# Patient Record
Sex: Male | Born: 1940 | Race: White | Marital: Single
Health system: Southern US, Community
[De-identification: ages and names within clinical notes are randomized; demographics above are authoritative.]

---

## 2013-11-15 ENCOUNTER — Other Ambulatory Visit: Payer: Self-pay | Admitting: Gastroenterology

## 2013-11-15 DIAGNOSIS — R109 Unspecified abdominal pain: Secondary | ICD-10-CM

## 2013-11-22 ENCOUNTER — Ambulatory Visit
Admission: RE | Admit: 2013-11-22 | Discharge: 2013-11-22 | Disposition: A | Discharge status: Home / Self Care | Payer: Medicare Other | Source: Ambulatory Visit | Attending: Gastroenterology | Admitting: Gastroenterology

## 2013-11-22 DIAGNOSIS — R109 Unspecified abdominal pain: Secondary | ICD-10-CM

## 2013-11-22 MED ORDER — IOHEXOL 300 MG/ML  SOLN
100.0000 mL | Freq: Once | INTRAMUSCULAR | Status: AC | PRN
Start: 2013-11-22 — End: 2013-11-22
  Administered 2013-11-22: 100 mL via INTRAVENOUS

## 2015-03-02 IMAGING — CT CT ABD-PELV W/ CM
2 of 5 series · 16 of 46 positions shown, 18 images · IV contrast (READICAT/WATER & [ID] OMNI 300)
Comparison: None.

CLINICAL DATA: Abdominal pain with bloating for 3 weeks. History of
scalp melanoma in 7335.

EXAM:
CT ABDOMEN AND PELVIS WITH CONTRAST
TECHNIQUE: Multidetector CT imaging of the abdomen and pelvis was performed
using the standard protocol following bolus administration of
intravenous contrast.
CONTRAST:  100mL OMNIPAQUE IOHEXOL 300 MG/ML  SOLN

[Series 2: abd/pelvis with · axial · 0.76mm/px · z∈[-438,-8]mm · 13 of 98 slices shown, 15 images]
[im 6/98  soft-tissue]
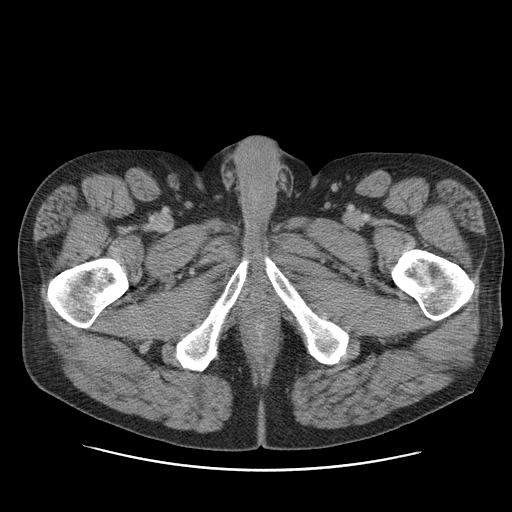
[im 6/98  bone]
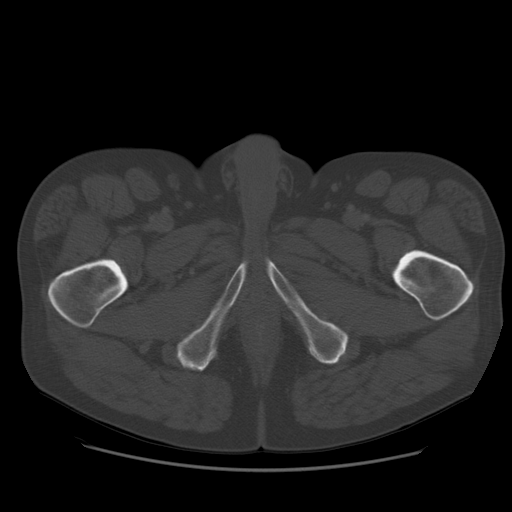
[im 16/98  soft-tissue]
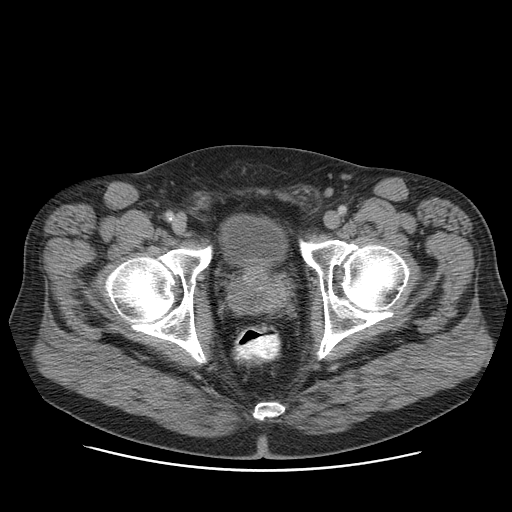
[im 21/98  soft-tissue]
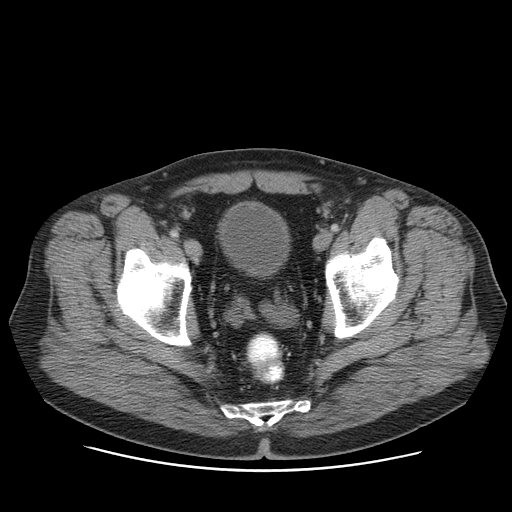
[im 26/98  soft-tissue]
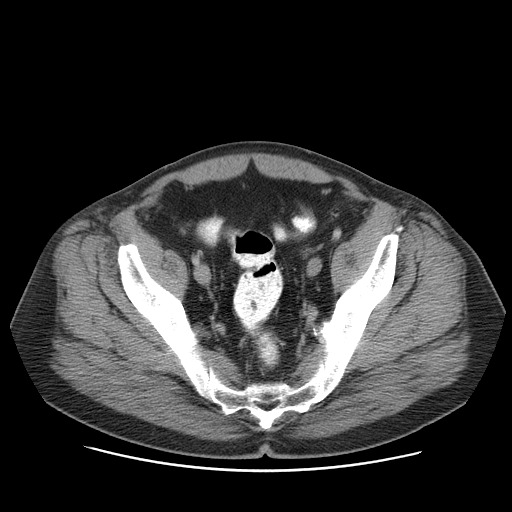
[im 36/98  soft-tissue]
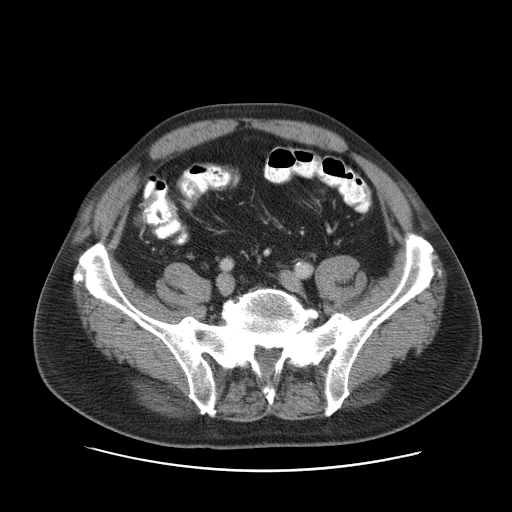
[im 41/98  soft-tissue]
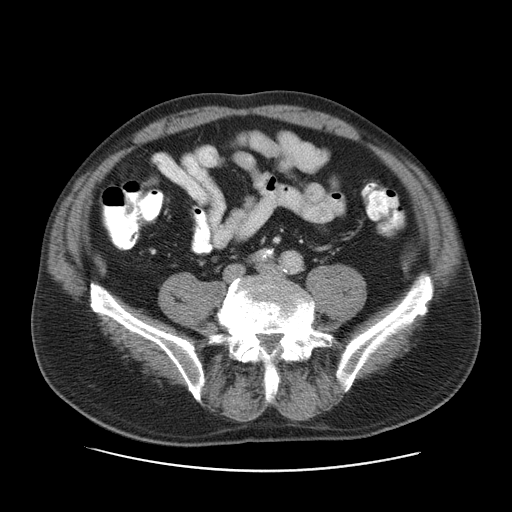
[im 52/98  soft-tissue]
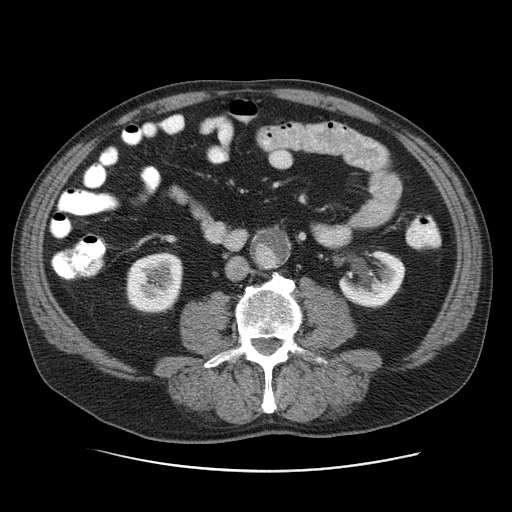
[im 57/98  soft-tissue]
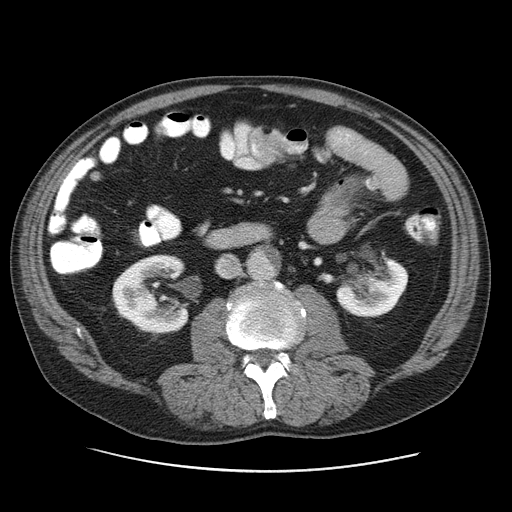
[im 62/98  soft-tissue]
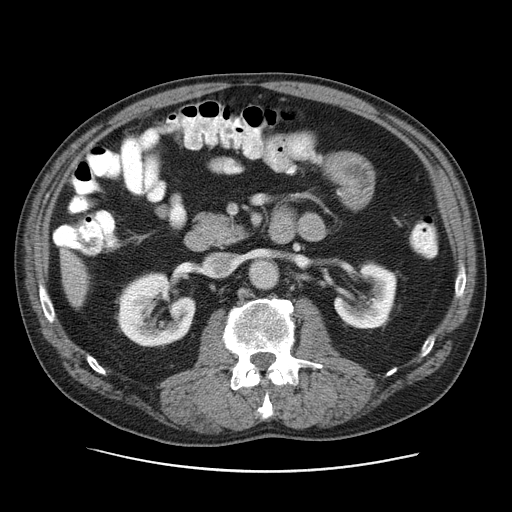
[im 62/98  bone]
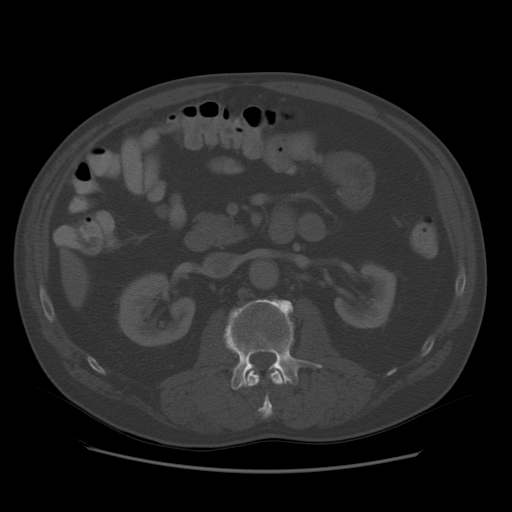
[im 72/98  soft-tissue]
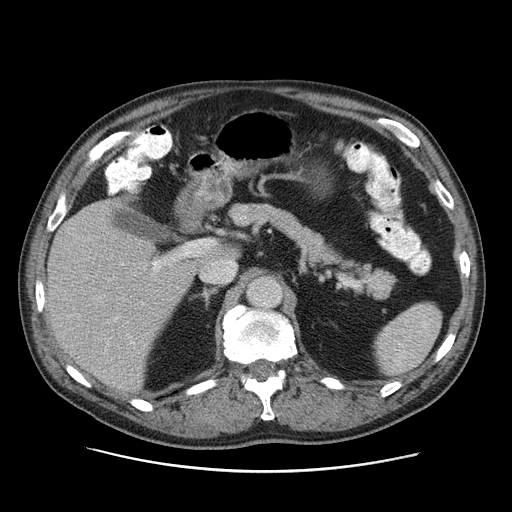
[im 77/98  soft-tissue]
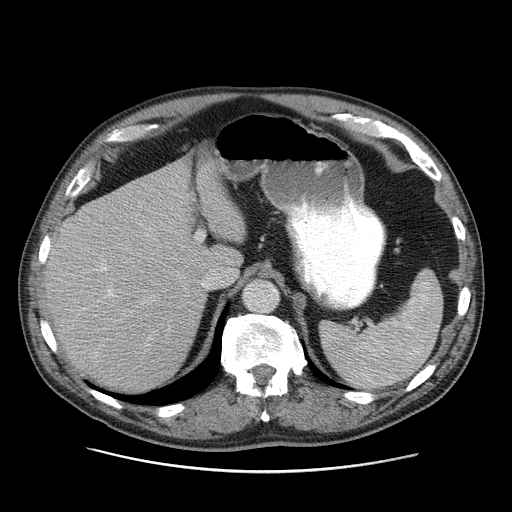
[im 82/98  soft-tissue]
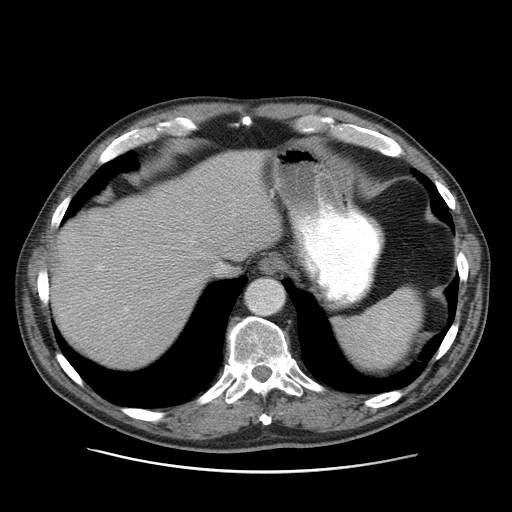
[im 92/98  soft-tissue]
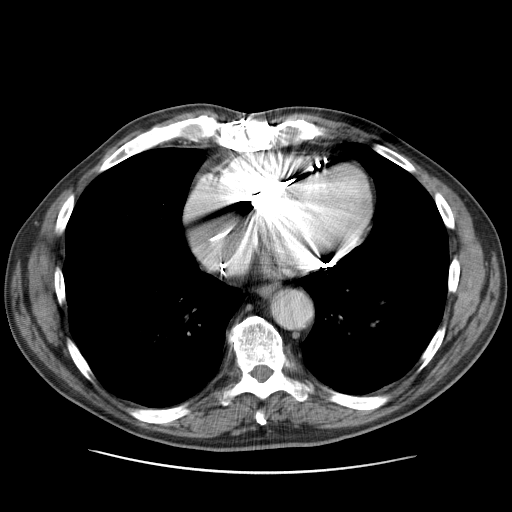

[Series 401: cor · coronal · 1.02mm/px · 3 of 144 slices shown]
[im 48/144  soft-tissue]
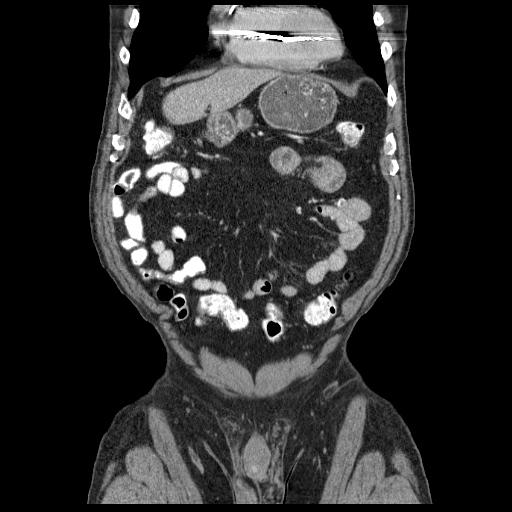
[im 64/144  soft-tissue]
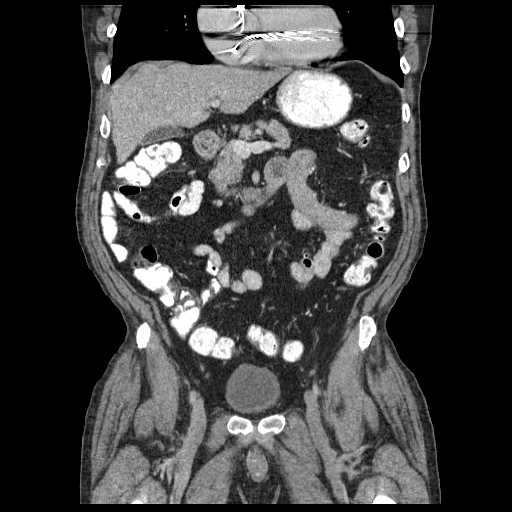
[im 80/144  soft-tissue]
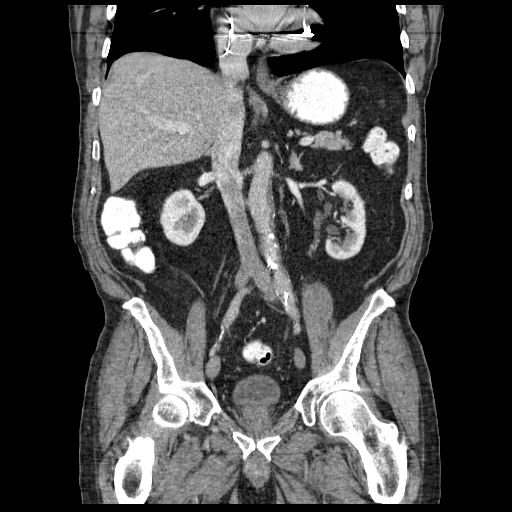

[16 of 46 positions shown; findings below may reference images not displayed]

FINDINGS: Lung bases: There is mild atelectasis or scarring in both lung
bases. Patient is status post median sternotomy. Pacemaker leads
extend into the right atrium and right ventricle. There is no
pleural or pericardial effusion.

Liver/Biliary/Pancreas: 7 mm low-density lesion in the medial
segment of the left hepatic lobe on image 20 is too small to
characterize. The liver otherwise appears unremarkable. No evidence
of gallstones, gallbladder wall thickening or biliary dilatation.
The pancreas appears normal.

Spleen/Adrenal glands: Unremarkable.

Kidneys/Ureters/Bladder: The left kidney demonstrates mild
malrotation and small parapelvic cysts. There is no evidence of
renal mass or hydronephrosis. There is no evidence of urinary tract
calculus. The bladder appears normal.

Bowel/Peritoneum: The stomach appears normal. There are multiple
small bowel diverticula. The appendix is not clearly seen. The colon
demonstrates no significant findings. No ascites or peritoneal
nodularity.

Retroperitoneum/Pelvis: There are no enlarged abdominal or pelvic
lymph nodes. There is fusiform aneurysmal dilatation of the
abdominal aorta which measures up to 3.4 cm AP on image 49. There is
moderate mural thrombus within the dilated esophageal lumen. There
is mild enlargement of the prostate gland.

Abdominal wall: No abdominal wall masses or hernias.

Musculoskeletal: No acute or significant osseus findings. There are
mild degenerative changes throughout the spine.
IMPRESSION: 1. No explanation for abdominal pain or bloating identified. There
is no evidence of bowel obstruction or ascites.
2. 3.4 cm abdominal aortic aneurysm.
3. Small liver lesion, likely incidental.
4. Small bowel diverticulosis without acute inflammation.

## 2020-12-24 ENCOUNTER — Telehealth: Payer: Self-pay | Admitting: Physical Medicine and Rehabilitation

## 2020-12-24 NOTE — Telephone Encounter (Signed)
Patient states he is waiting on Disability paperwork to be completed. Please advise status, did not see in signed folder this morning.  Thanks

## 2020-12-26 NOTE — Telephone Encounter (Signed)
Done. thanks

## 2022-11-11 DIAGNOSIS — L03031 Cellulitis of right toe: Secondary | ICD-10-CM | POA: Diagnosis not present

## 2022-11-11 DIAGNOSIS — M79674 Pain in right toe(s): Secondary | ICD-10-CM | POA: Diagnosis not present

## 2022-11-30 DIAGNOSIS — L570 Actinic keratosis: Secondary | ICD-10-CM | POA: Diagnosis not present

## 2022-11-30 DIAGNOSIS — L918 Other hypertrophic disorders of the skin: Secondary | ICD-10-CM | POA: Diagnosis not present

## 2023-04-05 DIAGNOSIS — J019 Acute sinusitis, unspecified: Secondary | ICD-10-CM | POA: Diagnosis not present

## 2023-04-05 DIAGNOSIS — R0981 Nasal congestion: Secondary | ICD-10-CM | POA: Diagnosis not present

## 2023-04-05 DIAGNOSIS — R030 Elevated blood-pressure reading, without diagnosis of hypertension: Secondary | ICD-10-CM | POA: Diagnosis not present

## 2023-04-05 DIAGNOSIS — R059 Cough, unspecified: Secondary | ICD-10-CM | POA: Diagnosis not present

## 2023-06-29 DIAGNOSIS — M62830 Muscle spasm of back: Secondary | ICD-10-CM | POA: Diagnosis not present

## 2023-07-19 DIAGNOSIS — Z950 Presence of cardiac pacemaker: Secondary | ICD-10-CM | POA: Diagnosis not present

## 2023-07-19 DIAGNOSIS — R52 Pain, unspecified: Secondary | ICD-10-CM | POA: Diagnosis not present

## 2023-07-19 DIAGNOSIS — I714 Abdominal aortic aneurysm, without rupture, unspecified: Secondary | ICD-10-CM | POA: Diagnosis not present

## 2023-07-19 DIAGNOSIS — M25552 Pain in left hip: Secondary | ICD-10-CM | POA: Diagnosis not present

## 2023-07-19 DIAGNOSIS — G8929 Other chronic pain: Secondary | ICD-10-CM | POA: Diagnosis not present

## 2023-07-19 DIAGNOSIS — Z87891 Personal history of nicotine dependence: Secondary | ICD-10-CM | POA: Diagnosis not present

## 2023-07-19 DIAGNOSIS — Z951 Presence of aortocoronary bypass graft: Secondary | ICD-10-CM | POA: Diagnosis not present

## 2023-07-19 DIAGNOSIS — M545 Low back pain, unspecified: Secondary | ICD-10-CM | POA: Diagnosis not present

## 2023-07-19 DIAGNOSIS — I723 Aneurysm of iliac artery: Secondary | ICD-10-CM | POA: Diagnosis not present

## 2023-07-19 DIAGNOSIS — I7143 Infrarenal abdominal aortic aneurysm, without rupture: Secondary | ICD-10-CM | POA: Diagnosis not present

## 2023-07-19 DIAGNOSIS — M47816 Spondylosis without myelopathy or radiculopathy, lumbar region: Secondary | ICD-10-CM | POA: Diagnosis not present

## 2023-07-19 DIAGNOSIS — M47817 Spondylosis without myelopathy or radiculopathy, lumbosacral region: Secondary | ICD-10-CM | POA: Diagnosis not present

## 2023-07-19 DIAGNOSIS — M48061 Spinal stenosis, lumbar region without neurogenic claudication: Secondary | ICD-10-CM | POA: Diagnosis not present

## 2023-08-31 DIAGNOSIS — D2239 Melanocytic nevi of other parts of face: Secondary | ICD-10-CM | POA: Diagnosis not present

## 2023-08-31 DIAGNOSIS — J0140 Acute pansinusitis, unspecified: Secondary | ICD-10-CM | POA: Diagnosis not present
